# Patient Record
Sex: Female | Born: 2005 | Race: Black or African American | Hispanic: No | Marital: Single
Health system: Southern US, Community
[De-identification: ages and names within clinical notes are randomized; demographics above are authoritative.]

## PROBLEM LIST (undated history)

## (undated) HISTORY — PX: NO PAST SURGERIES: SHX2092

---
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
## 2024-03-06 ENCOUNTER — Emergency Department (HOSPITAL_COMMUNITY)
Admission: EM | Admit: 2024-03-06 | Discharge: 2024-03-07 | Disposition: A | Attending: Emergency Medicine | Admitting: Emergency Medicine

## 2024-03-06 ENCOUNTER — Other Ambulatory Visit: Payer: Self-pay

## 2024-03-06 ENCOUNTER — Encounter (HOSPITAL_COMMUNITY): Payer: Self-pay

## 2024-03-06 DIAGNOSIS — Z3A10 10 weeks gestation of pregnancy: Secondary | ICD-10-CM | POA: Diagnosis not present

## 2024-03-06 DIAGNOSIS — O219 Vomiting of pregnancy, unspecified: Secondary | ICD-10-CM | POA: Diagnosis present

## 2024-03-06 DIAGNOSIS — R112 Nausea with vomiting, unspecified: Secondary | ICD-10-CM

## 2024-03-06 LAB — CBC WITH DIFFERENTIAL/PLATELET
Abs Immature Granulocytes: 0.02 K/uL (ref 0.00–0.07)
Basophils Absolute: 0.1 K/uL (ref 0.0–0.1)
Basophils Relative: 1 %
Eosinophils Absolute: 0.2 K/uL (ref 0.0–0.5)
Eosinophils Relative: 3 %
HCT: 36 % (ref 36.0–46.0)
Hemoglobin: 12.4 g/dL (ref 12.0–15.0)
Immature Granulocytes: 0 %
Lymphocytes Relative: 32 %
Lymphs Abs: 2.5 K/uL (ref 0.7–4.0)
MCH: 29.1 pg (ref 26.0–34.0)
MCHC: 34.4 g/dL (ref 30.0–36.0)
MCV: 84.5 fL (ref 80.0–100.0)
Monocytes Absolute: 0.8 K/uL (ref 0.1–1.0)
Monocytes Relative: 10 %
Neutro Abs: 4.3 K/uL (ref 1.7–7.7)
Neutrophils Relative %: 54 %
Platelets: 172 K/uL (ref 150–400)
RBC: 4.26 MIL/uL (ref 3.87–5.11)
RDW: 12.1 % (ref 11.5–15.5)
WBC: 7.9 K/uL (ref 4.0–10.5)
nRBC: 0 % (ref 0.0–0.2)

## 2024-03-06 MED ORDER — FAMOTIDINE IN NACL 20-0.9 MG/50ML-% IV SOLN
20.0000 mg | Freq: Once | INTRAVENOUS | Status: AC
  Administered 2024-03-07: 20 mg via INTRAVENOUS
  Filled 2024-03-06: qty 50

## 2024-03-06 MED ORDER — ACETAMINOPHEN 500 MG PO TABS
1000.0000 mg | ORAL_TABLET | Freq: Once | ORAL | Status: AC
  Administered 2024-03-06: 1000 mg via ORAL
  Filled 2024-03-06: qty 2

## 2024-03-06 MED ORDER — ONDANSETRON HCL 4 MG/2ML IJ SOLN
4.0000 mg | Freq: Once | INTRAMUSCULAR | Status: DC
  Filled 2024-03-06: qty 2

## 2024-03-06 MED ORDER — LACTATED RINGERS IV BOLUS
1000.0000 mL | Freq: Once | INTRAVENOUS | Status: AC
  Administered 2024-03-06: 1000 mL via INTRAVENOUS

## 2024-03-06 NOTE — ED Triage Notes (Signed)
 Pt to ED from home with c/o emesis during pregnancy, pt reports prenatal care at [facility] in [place], says she is [redacted] weeks pregnant. Reports vomiting red stuff that looked like blood around 10:15. Pt reports eating Trix cereal prior to vomiting. Pt has not vomited in an hour.

## 2024-03-07 DIAGNOSIS — O219 Vomiting of pregnancy, unspecified: Secondary | ICD-10-CM | POA: Diagnosis not present

## 2024-03-07 LAB — COMPREHENSIVE METABOLIC PANEL WITH GFR
ALT: 10 U/L (ref 0–44)
AST: 16 U/L (ref 15–41)
Albumin: 4 g/dL (ref 3.5–5.0)
Alkaline Phosphatase: 65 U/L (ref 38–126)
Anion gap: 10 (ref 5–15)
BUN: 11 mg/dL (ref 6–20)
CO2: 21 mmol/L — ABNORMAL LOW (ref 22–32)
Calcium: 8.9 mg/dL (ref 8.9–10.3)
Chloride: 105 mmol/L (ref 98–111)
Creatinine, Ser: 0.57 mg/dL (ref 0.44–1.00)
GFR, Estimated: >60 mL/min (ref >60)
Glucose, Bld: 81 mg/dL (ref 70–99)
Potassium: 3.9 mmol/L (ref 3.5–5.1)
Sodium: 136 mmol/L (ref 135–145)
Total Bilirubin: <0.2 mg/dL (ref 0.0–1.2)
Total Protein: 6.7 g/dL (ref 6.5–8.1)

## 2024-03-07 LAB — TYPE AND SCREEN
ABO/RH(D): O POS
Antibody Screen: NEGATIVE

## 2024-03-07 LAB — HCG, QUANTITATIVE, PREGNANCY: hCG, Beta Chain, Quant, S: 111612 m[IU]/mL — ABNORMAL HIGH (ref <5)

## 2024-03-07 LAB — LIPASE, BLOOD: Lipase: 20 U/L (ref 11–51)

## 2024-03-07 MED ORDER — ONDANSETRON 4 MG PO TBDP: 4.0000 mg | ORAL_TABLET | Freq: Three times a day (TID) | ORAL | 0 refills | Status: DC | PRN

## 2024-03-07 MED ORDER — PRENATAL VITAMIN 27-0.8 MG PO TABS: 1.0000 | ORAL_TABLET | Freq: Every day | ORAL | 4 refills | Status: AC

## 2024-03-07 MED ORDER — FAMOTIDINE 20 MG PO TABS: 20.0000 mg | ORAL_TABLET | Freq: Two times a day (BID) | ORAL | 0 refills | Status: AC

## 2024-03-07 NOTE — ED Notes (Signed)
 Pt tolerated food & drink without nausea.

## 2024-03-07 NOTE — ED Notes (Signed)
 Pt given crackers & water per order.

## 2024-03-07 NOTE — ED Provider Notes (Signed)
 Emergency Department Provider Note  TRIAGE NOTE: Pt to ED from home with c/o emesis during pregnancy, pt reports prenatal care at [facility] in [place], says she is [redacted] weeks pregnant. Reports vomiting red stuff that looked like blood around 10:15. Pt reports eating Trix cereal prior to vomiting. Pt has not vomited in an hour.  HISTORY  Chief Complaint Emesis During Pregnancy   HPI Kathy Cooke is a 18 y.o. female with  [redacted] weeks pregnant (G1, P0), who presents with nausea and vomiting accompanied by blood in the vomit. She reports having vomited three times, with blood noted in the last two episodes. The vomiting began earlier today, and she also developed a severe headache that has persisted despite waiting for it to subside. She denies any abdominal pain, urinary symptoms, heartburn, or morning sickness thus far in her pregnancy. There is no history of fever, recent illness, or sick contacts. The patient has not taken any medications for her symptoms. She has not yet started prenatal vitamins. History was obtained from the patient.  PMH History reviewed. No pertinent past medical history.  Home Medications Prior to Admission medications   Medication Sig Start Date End Date Taking? Authorizing Provider  famotidine (PEPCID) 20 MG tablet Take 1 tablet (20 mg total) by mouth 2 (two) times daily. 03/07/24  Yes Wrigley Plasencia, Selinda, MD  ondansetron  (ZOFRAN -ODT) 4 MG disintegrating tablet Take 1 tablet (4 mg total) by mouth every 8 (eight) hours as needed for vomiting. 03/07/24  Yes Doctor Sheahan, Selinda, MD  Prenatal Vit-Fe Fumarate-FA (PRENATAL VITAMIN) 27-0.8 MG TABS Take 1 tablet by mouth daily. 03/07/24  Yes Kaley Jutras, Selinda, MD  anti-nausea (EMETROL) solution Take 10 mLs by mouth every 15 (fifteen) minutes as needed for nausea or vomiting.    [provider]  diphenhydrAMINE (BENADRYL) 12.5 MG/5ML elixir Take 12.5 mg by mouth 4 (four) times daily as needed for allergies.    [provider]     Social History Social History   Tobacco Use   Smoking status: Passive Smoke Exposure - Never Smoker   Smokeless tobacco: Never  Vaping Use   Vaping status: Never Used  Substance Use Topics   Alcohol use: No   Drug use: No    Review of Systems: Documented in HPI ____________________________________________  PHYSICAL EXAM: VITAL SIGNS: Triage: Blood pressure 119/88, pulse 74, temperature 98 F (36.7 C), temperature source Oral, resp. rate 16, height 4' 11 (1.499 m), weight 53.3 kg, last menstrual period 12/13/2023, SpO2 100%.  Vitals:   03/06/24 2321 03/07/24 0225 03/07/24 0320  BP: 132/87 119/88 119/88  Pulse: 72 74 74  Resp: 17 16 16   Temp: 98 F (36.7 C) 98 F (36.7 C) 98 F (36.7 C)  TempSrc:  Oral Oral  SpO2: 99% 100% 100%  Weight: 53.3 kg    Height: 4' 11 (1.499 m)      Physical Exam Vitals and nursing note reviewed.  Constitutional:      Appearance: She is well-developed.  HENT:     Head: Normocephalic and atraumatic.  Cardiovascular:     Rate and Rhythm: Normal rate and regular rhythm.  Pulmonary:     Effort: No respiratory distress.     Breath sounds: No stridor.  Abdominal:     General: There is no distension.  Musculoskeletal:     Cervical back: Normal range of motion.  Neurological:     Mental Status: She is alert.       ____________________________________________   LABS (all labs ordered are  listed, but only abnormal results are displayed)  Labs Reviewed  COMPREHENSIVE METABOLIC PANEL WITH GFR - Abnormal; Notable for the following components:      Result Value   CO2 21 (*)    All other components within normal limits  HCG, QUANTITATIVE, PREGNANCY - Abnormal; Notable for the following components:   hCG, Beta Chain, Quant, S [id number] (*)    All other components within normal limits  CBC WITH DIFFERENTIAL/PLATELET  LIPASE, BLOOD  TYPE AND SCREEN   ____________________________________________  EKG   EKG  Interpretation Date/Time:    Ventricular Rate:    PR Interval:    QRS Duration:    QT Interval:    QTC Calculation:   R Axis:      Text Interpretation:          ____________________________________________  RADIOLOGY  No results found. ____________________________________________  PROCEDURES  Procedure(s) performed:   Procedures ____________________________________________  INITIAL IMPRESSION / ASSESSMENT AND PLAN   Clinical Course as of 03/07/24 0511  Tue Mar 07, 2024  0010 Initial Evaluation:  Patient is pregnant at approximately ten weeks gestation, presenting with abdominal discomfort, nausea, vomiting, and blood in the vomit on two occasions. Plan:  Administer intravenous fluids Administer Tylenol for headache relief Prescribe H2 blocker for gastric protection Perform an ultrasound to assess fetal well-being and confirm intrauterine pregnancy Discharge with follow-up with obstetrics if condition stabilizes Provide a prescription for prenatal vitamins [JM]  0045 Potassium: 3.9 [JM]  0045 CO2(!): 21 [JM]  0045 Creatinine: 0.57 [JM]  0045 WBC: 7.9 [JM]  0045 HCG, Beta Chain, Earleen RAMAN(!): [id number] BUS showed IUP with good fetal movement, good HR [JM]    Clinical Course User Index [JM] Cristian Davitt, Selinda, MD     Images ordered viewed and obtained by myself. Agree with Radiology interpretation. Details in ED course.  Labs ordered reviewed by myself as detailed in ED course.  Consultations obtained/considered detailed in ED course.    CRITICAL INTERVENTIONS:  N/a  Bedside ultrasound reassuring good fetal movement and heart rate labs reassuring.  Symptoms improved.  Low suspicion for hepatobiliary or other metabolic issues.  Patient will continue Pepcid at home with OB follow-up.  Prenatal vitamins sent to the pharmacy.SABRA   FINAL IMPRESSION Final diagnoses:  Nausea and vomiting, unspecified vomiting type     Disposition A medical screening exam was  performed and I feel the patient has had an appropriate workup for their chief complaint at this time and likelihood of emergent condition existing is low. They have been counseled on decision, DISCHARGE, follow up and which symptoms necessitate immediate return to the emergency department. They or their family verbally stated understanding and agreement with plan and discharged in stable condition.   ____________________________________________   NEW OUTPATIENT MEDICATIONS STARTED DURING THIS VISIT:  Discharge Medication List as of 03/07/2024  3:15 AM     START taking these medications   Details  famotidine (PEPCID) 20 MG tablet Take 1 tablet (20 mg total) by mouth 2 (two) times daily., Starting Tue 03/07/2024, Normal        Note:  This note was prepared with assistance of Dragon voice recognition software. Occasional wrong-word or sound-a-like substitutions may have occurred due to the inherent limitations of voice recognition software.    Lorette Selinda, MD 03/07/24 [phone]

## 2024-03-25 ENCOUNTER — Other Ambulatory Visit: Payer: Self-pay

## 2024-03-25 ENCOUNTER — Encounter (HOSPITAL_COMMUNITY): Payer: Self-pay | Admitting: Emergency Medicine

## 2024-03-25 ENCOUNTER — Emergency Department (HOSPITAL_COMMUNITY)
Admission: EM | Admit: 2024-03-25 | Discharge: 2024-03-25 | Disposition: A | Discharge status: Home / Self Care | Attending: Emergency Medicine | Admitting: Emergency Medicine

## 2024-03-25 DIAGNOSIS — O219 Vomiting of pregnancy, unspecified: Secondary | ICD-10-CM | POA: Insufficient documentation

## 2024-03-25 DIAGNOSIS — E860 Dehydration: Secondary | ICD-10-CM | POA: Insufficient documentation

## 2024-03-25 DIAGNOSIS — Z3A13 13 weeks gestation of pregnancy: Secondary | ICD-10-CM | POA: Insufficient documentation

## 2024-03-25 DIAGNOSIS — R519 Headache, unspecified: Secondary | ICD-10-CM | POA: Insufficient documentation

## 2024-03-25 DIAGNOSIS — M545 Low back pain, unspecified: Secondary | ICD-10-CM | POA: Insufficient documentation

## 2024-03-25 DIAGNOSIS — D72829 Elevated white blood cell count, unspecified: Secondary | ICD-10-CM | POA: Insufficient documentation

## 2024-03-25 DIAGNOSIS — N390 Urinary tract infection, site not specified: Secondary | ICD-10-CM

## 2024-03-25 DIAGNOSIS — O2341 Unspecified infection of urinary tract in pregnancy, first trimester: Secondary | ICD-10-CM | POA: Insufficient documentation

## 2024-03-25 LAB — URINALYSIS, ROUTINE W REFLEX MICROSCOPIC
Bilirubin Urine: NEGATIVE
Glucose, UA: NEGATIVE mg/dL
Ketones, ur: 20 mg/dL — AB
Nitrite: POSITIVE — AB
Protein, ur: 30 mg/dL — AB
Specific Gravity, Urine: 1.015 (ref 1.005–1.030)
WBC, UA: >50 WBC/hpf (ref 0–5)
pH: 6 (ref 5.0–8.0)

## 2024-03-25 LAB — CBC WITH DIFFERENTIAL/PLATELET
Abs Immature Granulocytes: 0.04 K/uL (ref 0.00–0.07)
Basophils Absolute: 0 K/uL (ref 0.0–0.1)
Basophils Relative: 0 %
Eosinophils Absolute: 0 K/uL (ref 0.0–0.5)
Eosinophils Relative: 0 %
HCT: 33.7 % — ABNORMAL LOW (ref 36.0–46.0)
Hemoglobin: 11.8 g/dL — ABNORMAL LOW (ref 12.0–15.0)
Immature Granulocytes: 0 %
Lymphocytes Relative: 9 %
Lymphs Abs: 1.1 K/uL (ref 0.7–4.0)
MCH: 28.8 pg (ref 26.0–34.0)
MCHC: 35 g/dL (ref 30.0–36.0)
MCV: 82.2 fL (ref 80.0–100.0)
Monocytes Absolute: 1.3 K/uL — ABNORMAL HIGH (ref 0.1–1.0)
Monocytes Relative: 11 %
Neutro Abs: 9.7 K/uL — ABNORMAL HIGH (ref 1.7–7.7)
Neutrophils Relative %: 80 %
Platelets: 191 K/uL (ref 150–400)
RBC: 4.1 MIL/uL (ref 3.87–5.11)
RDW: 11.4 % — ABNORMAL LOW (ref 11.5–15.5)
WBC: 12.2 K/uL — ABNORMAL HIGH (ref 4.0–10.5)
nRBC: 0 % (ref 0.0–0.2)

## 2024-03-25 LAB — COMPREHENSIVE METABOLIC PANEL WITH GFR
ALT: 13 U/L (ref 0–44)
AST: 18 U/L (ref 15–41)
Albumin: 3.9 g/dL (ref 3.5–5.0)
Alkaline Phosphatase: 76 U/L (ref 38–126)
Anion gap: 10 (ref 5–15)
BUN: 9 mg/dL (ref 6–20)
CO2: 23 mmol/L (ref 22–32)
Calcium: 9.3 mg/dL (ref 8.9–10.3)
Chloride: 99 mmol/L (ref 98–111)
Creatinine, Ser: 0.5 mg/dL (ref 0.44–1.00)
GFR, Estimated: >60 mL/min (ref >60)
Glucose, Bld: 102 mg/dL — ABNORMAL HIGH (ref 70–99)
Potassium: 3.8 mmol/L (ref 3.5–5.1)
Sodium: 133 mmol/L — ABNORMAL LOW (ref 135–145)
Total Bilirubin: 0.3 mg/dL (ref 0.0–1.2)
Total Protein: 7.3 g/dL (ref 6.5–8.1)

## 2024-03-25 LAB — LIPASE, BLOOD: Lipase: 13 U/L (ref 11–51)

## 2024-03-25 MED ORDER — LIDOCAINE 5 % EX PTCH
1.0000 | MEDICATED_PATCH | Freq: Once | CUTANEOUS | Status: DC
  Administered 2024-03-25: 1 via TRANSDERMAL
  Filled 2024-03-25: qty 1

## 2024-03-25 MED ORDER — CEPHALEXIN 500 MG PO CAPS: 500.0000 mg | ORAL_CAPSULE | Freq: Two times a day (BID) | ORAL | 0 refills | Status: AC

## 2024-03-25 MED ORDER — METOCLOPRAMIDE HCL 10 MG PO TABS: 10.0000 mg | ORAL_TABLET | Freq: Four times a day (QID) | ORAL | 0 refills | Status: DC

## 2024-03-25 MED ORDER — METOCLOPRAMIDE HCL 5 MG/ML IJ SOLN
10.0000 mg | Freq: Once | INTRAMUSCULAR | Status: AC
  Administered 2024-03-25: 10 mg via INTRAVENOUS
  Filled 2024-03-25: qty 2

## 2024-03-25 MED ORDER — LACTATED RINGERS IV BOLUS
1000.0000 mL | Freq: Once | INTRAVENOUS | Status: AC
  Administered 2024-03-25: 1000 mL via INTRAVENOUS

## 2024-03-25 MED ORDER — SODIUM CHLORIDE 0.9 % IV SOLN
1.0000 g | Freq: Once | INTRAVENOUS | Status: AC
  Administered 2024-03-25: 1 g via INTRAVENOUS
  Filled 2024-03-25: qty 10

## 2024-03-25 NOTE — ED Triage Notes (Signed)
 Patient c/o n/v and lower back pain x 2 days.  Patient is [redacted] weeks pregnant, has been taking zofran  without relief.  Patient's right eye is red from vomiting per patient.

## 2024-03-25 NOTE — Discharge Instructions (Addendum)
 Begin taking Keflex  as prescribed.  Stay hydrated and drink plenty of clear fluids.  May take Tylenol  every 6 hours as needed for pain.  May take Reglan  every 6-8 hours as needed for any nausea/vomiting.  Do not take Zofran  at the same time.  Follow-up with OB as scheduled in the next couple weeks for recheck and to recheck urine as well.  Return to ED if any symptoms worsen including uncontrolled nausea/vomiting, severe abdominal pain, vaginal bleeding/spotting, severe blood in the urine.

## 2024-03-25 NOTE — ED Provider Notes (Signed)
 [place] EMERGENCY DEPARTMENT AT [facility] Provider Note   CSN: [id number] Arrival date & time: 03/25/24  1941     Patient presents with: Emesis and Back Pain   Kathy Cooke is a 18 y.o. female.  Patient is a 19 year old female at approximately [redacted] weeks gestation who presents to the ED for increasing nausea/vomiting for the past 3 days.  She also notes she has had right lower back pain for the past 2 days.  She states she has been taking Zofran  with minimal relief.  She also notes that her eye has become red over the past 2 days from vomiting so much.  States vision does feel normal though.  States she had a headache a few days ago but that has improved.  She has not taken any Tylenol  today.  She has seen OB and has had confirmed pregnancy on ultrasound.  Denies fevers, dizziness, syncope, chest pain, shortness of breath, abdominal pain, pelvic pain/cramping, vaginal bleeding/spotting, urinary symptoms.    Emesis Associated symptoms: no abdominal pain and no fever   Back Pain Associated symptoms: no abdominal pain, no chest pain, no dysuria, no fever and no pelvic pain        Prior to Admission medications   Medication Sig Start Date End Date Taking? Authorizing Provider  cephALEXin  (KEFLEX ) 500 MG capsule Take 1 capsule (500 mg total) by mouth 2 (two) times daily for 7 days. 03/25/24 04/01/24 Yes Sunset Joshi, Thersia RAMAN, PA-C  metoCLOPramide  (REGLAN ) 10 MG tablet Take 1 tablet (10 mg total) by mouth every 6 (six) hours for 5 days. 03/25/24 03/30/24 Yes Zymarion Favorite S, PA-C  anti-nausea (EMETROL) solution Take 10 mLs by mouth every 15 (fifteen) minutes as needed for nausea or vomiting.    [provider]  diphenhydrAMINE (BENADRYL) 12.5 MG/5ML elixir Take 12.5 mg by mouth 4 (four) times daily as needed for allergies.    [provider]  famotidine  (PEPCID ) 20 MG tablet Take 1 tablet (20 mg total) by mouth 2 (two) times daily. 03/07/24   Mesner, Selinda, MD   ondansetron  (ZOFRAN -ODT) 4 MG disintegrating tablet Take 1 tablet (4 mg total) by mouth every 8 (eight) hours as needed for vomiting. 03/07/24   Mesner, Selinda, MD  Prenatal Vit-Fe Fumarate-FA (PRENATAL VITAMIN) 27-0.8 MG TABS Take 1 tablet by mouth daily. 03/07/24   Mesner, Selinda, MD    Allergies: Patient has no known allergies.    Review of Systems  Constitutional:  Negative for fever.  Eyes:  Negative for visual disturbance.  Respiratory:  Negative for shortness of breath.   Cardiovascular:  Negative for chest pain.  Gastrointestinal:  Positive for nausea and vomiting. Negative for abdominal pain.  Genitourinary:  Negative for dysuria, pelvic pain, vaginal bleeding, vaginal discharge and vaginal pain.  Musculoskeletal:  Positive for back pain.  All other systems reviewed and are negative.   Updated Vital Signs BP 124/68   Pulse (!) 106   Temp 98.9 F (37.2 C) (Oral)   Ht 4' 11 (1.499 m)   Wt 52.2 kg   LMP 12/13/2023 (Approximate)   SpO2 99%   BMI 23.23 kg/m   Physical Exam Constitutional:      Appearance: Normal appearance.  HENT:     Head: Normocephalic and atraumatic.     Nose: Nose normal.     Mouth/Throat:     Mouth: Mucous membranes are moist.     Pharynx: Oropharynx is clear.  Eyes:     Extraocular Movements: Extraocular movements intact.  Pupils: Pupils are equal, round, and reactive to light.     Comments: Subconjunctival hematoma present on the right sclera.  Visual acuity intact.  Cardiovascular:     Rate and Rhythm: Normal rate.  Pulmonary:     Effort: Pulmonary effort is normal.  Abdominal:     General: Bowel sounds are normal.     Palpations: Abdomen is soft.     Tenderness: There is no abdominal tenderness.  Musculoskeletal:        General: Normal range of motion.     Comments: Left lower lateral flank tenderness.  No deformities, erythema, edema noted.  Full range of motion of bilateral lower extremities equal strength.  Skin:    General:  Skin is warm and dry.  Neurological:     Mental Status: She is alert and oriented to person, place, and time.  Psychiatric:        Mood and Affect: Mood normal.        Behavior: Behavior normal.     (all labs ordered are listed, but only abnormal results are displayed) Labs Reviewed  COMPREHENSIVE METABOLIC PANEL WITH GFR - Abnormal; Notable for the following components:      Result Value   Sodium 133 (*)    Glucose, Bld 102 (*)    All other components within normal limits  CBC WITH DIFFERENTIAL/PLATELET - Abnormal; Notable for the following components:   WBC 12.2 (*)    Hemoglobin 11.8 (*)    HCT 33.7 (*)    RDW 11.4 (*)    Neutro Abs 9.7 (*)    Monocytes Absolute 1.3 (*)    All other components within normal limits  URINALYSIS, ROUTINE W REFLEX MICROSCOPIC - Abnormal; Notable for the following components:   APPearance HAZY (*)    Hgb urine dipstick SMALL (*)    Ketones, ur 20 (*)    Protein, ur 30 (*)    Nitrite POSITIVE (*)    Leukocytes,Ua LARGE (*)    Bacteria, UA MANY (*)    All other components within normal limits  LIPASE, BLOOD    EKG: None  Radiology: No results found.    Medications Ordered in the ED  lidocaine  (LIDODERM ) 5 % 1 patch (1 patch Transdermal Patch Applied 03/25/24 2027)  cefTRIAXone  (ROCEPHIN ) 1 g in sodium chloride  0.9 % 100 mL IVPB (has no administration in time range)  lactated ringers  bolus 1,000 mL (0 mLs Intravenous Stopped 03/25/24 2124)  metoCLOPramide  (REGLAN ) injection 10 mg (10 mg Intravenous Given 03/25/24 2026)                                   Medical Decision Making Patient is a 18 year old female at approximately [redacted] weeks gestation who presents to the ED for increasing nausea/vomiting for the past 2 days.  Patient also notes some left lower back pain as well.  Please see detailed HPI above.  On exam patient is alert and in no acute distress.  Physical exam as noted above.  No abdominal tenderness appreciated.  No vaginal  bleeding/spotting noted, no concerns for emergent imaging.  UA does show concerns for UTI as well as some dehydration.  Mild leukocytosis of 12.2 noted on labs but otherwise creatinine normal.  Mild hyponatremia 133.  Patient was given a liter of fluids, Reglan , and Tylenol  and states she is feeling much better.  She was able to tolerate oral intake well.  Suspect patient's  back pain could be secondary to UTI but less concerns for pyelonephritis in the setting of reassuring labs.  Otherwise well-appearing and stable for discharge home.  Patient given Rocephin  while in ED.  Prescribed Keflex  and Reglan .  Symptomatic care discussed.  Advised to follow-up with OB as scheduled in the next 2 weeks for recheck.  Strict return precautions provided.  Amount and/or Complexity of Data Reviewed Labs: ordered.  Risk Prescription drug management.      Final diagnoses:  Vomiting pregnancy  Urinary tract infection with hematuria, site unspecified    ED Discharge Orders          Ordered    cephALEXin  (KEFLEX ) 500 MG capsule  2 times daily        03/25/24 2158    metoCLOPramide  (REGLAN ) 10 MG tablet  Every 6 hours        03/25/24 2158               Neysa Thersia GORMAN DEVONNA 03/25/24 2205    Melvenia Motto, MD 03/26/24 [phone]

## 2024-04-11 LAB — PANORAMA PRENATAL TEST FULL PANEL:PANORAMA TEST PLUS 5 ADDITIONAL MICRODELETIONS: FETAL FRACTION: 7.6

## 2024-05-26 ENCOUNTER — Encounter (HOSPITAL_COMMUNITY): Payer: Self-pay | Admitting: Emergency Medicine

## 2024-05-26 ENCOUNTER — Emergency Department (HOSPITAL_COMMUNITY)
Admission: EM | Admit: 2024-05-26 | Discharge: 2024-05-26 | Disposition: A | Discharge status: Home / Self Care | Source: Home / Self Care | Attending: Emergency Medicine | Admitting: Emergency Medicine

## 2024-05-26 ENCOUNTER — Other Ambulatory Visit: Payer: Self-pay

## 2024-05-26 DIAGNOSIS — R112 Nausea with vomiting, unspecified: Secondary | ICD-10-CM

## 2024-05-26 DIAGNOSIS — E860 Dehydration: Secondary | ICD-10-CM

## 2024-05-26 LAB — CBC WITH DIFFERENTIAL/PLATELET
Abs Immature Granulocytes: 0.08 10*3/uL — ABNORMAL HIGH (ref 0.00–0.07)
Basophils Absolute: 0 10*3/uL (ref 0.0–0.1)
Basophils Relative: 0 %
Eosinophils Absolute: 0.1 10*3/uL (ref 0.0–0.5)
Eosinophils Relative: 1 %
HCT: 34.2 % — ABNORMAL LOW (ref 36.0–46.0)
Hemoglobin: 11.7 g/dL — ABNORMAL LOW (ref 12.0–15.0)
Immature Granulocytes: 1 %
Lymphocytes Relative: 9 %
Lymphs Abs: 1 10*3/uL (ref 0.7–4.0)
MCH: 29.2 pg (ref 26.0–34.0)
MCHC: 34.2 g/dL (ref 30.0–36.0)
MCV: 85.3 fL (ref 80.0–100.0)
Monocytes Absolute: 0.9 10*3/uL (ref 0.1–1.0)
Monocytes Relative: 9 %
Neutro Abs: 8.6 10*3/uL — ABNORMAL HIGH (ref 1.7–7.7)
Neutrophils Relative %: 80 %
Platelets: 212 10*3/uL (ref 150–400)
RBC: 4.01 MIL/uL (ref 3.87–5.11)
RDW: 13.2 % (ref 11.5–15.5)
WBC: 10.7 10*3/uL — ABNORMAL HIGH (ref 4.0–10.5)
nRBC: 0 % (ref 0.0–0.2)

## 2024-05-26 LAB — COMPREHENSIVE METABOLIC PANEL WITH GFR
ALT: 25 U/L (ref 0–44)
AST: 24 U/L (ref 15–41)
Albumin: 3.7 g/dL (ref 3.5–5.0)
Alkaline Phosphatase: 113 U/L (ref 38–126)
Anion gap: 15 (ref 5–15)
BUN: 8 mg/dL (ref 6–20)
CO2: 19 mmol/L — ABNORMAL LOW (ref 22–32)
Calcium: 8.5 mg/dL — ABNORMAL LOW (ref 8.9–10.3)
Chloride: 107 mmol/L (ref 98–111)
Creatinine, Ser: 0.57 mg/dL (ref 0.44–1.00)
GFR, Estimated: >60 mL/min
Glucose, Bld: 103 mg/dL — ABNORMAL HIGH (ref 70–99)
Potassium: 3.6 mmol/L (ref 3.5–5.1)
Sodium: 140 mmol/L (ref 135–145)
Total Bilirubin: 0.3 mg/dL (ref 0.0–1.2)
Total Protein: 6.9 g/dL (ref 6.5–8.1)

## 2024-05-26 LAB — LIPASE, BLOOD: Lipase: 21 U/L (ref 11–51)

## 2024-05-26 MED ORDER — SODIUM CHLORIDE 0.9 % IV BOLUS
1000.0000 mL | Freq: Once | INTRAVENOUS | Status: AC
  Administered 2024-05-26: 1000 mL via INTRAVENOUS

## 2024-05-26 MED ORDER — ONDANSETRON 4 MG PO TBDP: 4.0000 mg | ORAL_TABLET | Freq: Three times a day (TID) | ORAL | 0 refills | Status: AC | PRN

## 2024-05-26 MED ORDER — ONDANSETRON HCL 4 MG/2ML IJ SOLN
4.0000 mg | Freq: Once | INTRAMUSCULAR | Status: AC
  Administered 2024-05-26: 4 mg via INTRAVENOUS
  Filled 2024-05-26: qty 2

## 2024-05-26 NOTE — Discharge Instructions (Addendum)

## 2024-05-26 NOTE — ED Triage Notes (Signed)
 Pt c/o N/V/D x lastnight, states she has had 3 episodes of diarrhea and then it stopped but the vomiting has been continuous. Pt is also approx. [redacted] weeks pregnant, states this is her first pregnancy. Also states she can still feel movement from fetus. C/o HA and abd  mid pressure 8/10

## 2024-05-26 NOTE — ED Notes (Signed)
 Pt has not had another episode of emesis since IV Zofran  administration, pt given water for PO challenge

## 2024-05-26 NOTE — ED Provider Notes (Signed)
 " [place] EMERGENCY DEPARTMENT AT [facility] Provider Note   CSN: [id number] Arrival date & time: 05/26/24  9750     Patient presents with: Emesis   Kathy Cooke is a 19 y.o. female.   The history is provided by the patient.  Patient is G1, P0 at approximately 21 weeks presents with nausea vomiting diarrhea Patient reports several hours ago she had multiple episodes of nonbloody diarrhea.  She is now having multiple episodes of emesis.  She also reports abdominal pressure, but no vaginal bleeding or vaginal discharge.  She has had an uncomplicated pregnancy thus far No recent travel, no known sick contacts     Prior to Admission medications  Medication Sig Start Date End Date Taking? Authorizing Provider  ondansetron  (ZOFRAN -ODT) 4 MG disintegrating tablet Take 1 tablet (4 mg total) by mouth every 8 (eight) hours as needed. 05/26/24  Yes Midge Golas, MD  anti-nausea (EMETROL) solution Take 10 mLs by mouth every 15 (fifteen) minutes as needed for nausea or vomiting.    [provider]  diphenhydrAMINE (BENADRYL) 12.5 MG/5ML elixir Take 12.5 mg by mouth 4 (four) times daily as needed for allergies.    [provider]  famotidine  (PEPCID ) 20 MG tablet Take 1 tablet (20 mg total) by mouth 2 (two) times daily. 03/07/24   Mesner, Selinda, MD  Prenatal Vit-Fe Fumarate-FA (PRENATAL VITAMIN) 27-0.8 MG TABS Take 1 tablet by mouth daily. 03/07/24   Mesner, Selinda, MD    Allergies: Patient has no known allergies.    Review of Systems  Constitutional:  Negative for fever.  Gastrointestinal:  Positive for abdominal pain, diarrhea, nausea and vomiting. Negative for blood in stool.  Genitourinary:  Negative for vaginal bleeding.    Updated Vital Signs BP 119/85   Pulse (!) 109   Temp 98.4 F (36.9 C) (Oral)   Resp 17   LMP 12/13/2023 (Approximate)   SpO2 100%   Physical Exam CONSTITUTIONAL: Well developed/well nourished, actively vomiting HEAD:  Normocephalic/atraumatic EYES: EOMI/PERRL, no icterus NECK: supple no meningeal signs CV: S1/S2 noted, tachycardic LUNGS: Lungs are clear to auscultation bilaterally, no apparent distress ABDOMEN: soft, nontender, no rebound or guarding NEURO: Pt is awake/alert/appropriate, moves all extremitiesx4.  No facial droop.   EXTREMITIES:full ROM SKIN: warm, color normal PSYCH: Anxious  (all labs ordered are listed, but only abnormal results are displayed) Labs Reviewed  CBC WITH DIFFERENTIAL/PLATELET - Abnormal; Notable for the following components:      Result Value   WBC 10.7 (*)    Hemoglobin 11.7 (*)    HCT 34.2 (*)    Neutro Abs 8.6 (*)    Abs Immature Granulocytes 0.08 (*)    All other components within normal limits  COMPREHENSIVE METABOLIC PANEL WITH GFR - Abnormal; Notable for the following components:   CO2 19 (*)    Glucose, Bld 103 (*)    Calcium 8.5 (*)    All other components within normal limits  LIPASE, BLOOD    EKG: None  Radiology: No results found.   Procedures   Medications Ordered in the ED  ondansetron  (ZOFRAN ) injection 4 mg (4 mg Intravenous Given 05/26/24 0329)  sodium chloride  0.9 % bolus 1,000 mL (0 mLs Intravenous Stopped 05/26/24 0409)  sodium chloride  0.9 % bolus 1,000 mL (1,000 mLs Intravenous Bolus 05/26/24 0425)    Clinical Course as of 05/26/24 0502  Fri May 26, 2024  9647 Patient presents with abrupt onset nausea vomiting diarrhea.  After IV fluids and Zofran ,  she is already feeling improved.  She has no focal abdominal tenderness.  Fetal heart tones in the 140s.  She is feeling movement Labs are pending at this time [DW]  0440 CO2(!): 19 Mild dehydration [DW]  0501 Patient feels much improved.  She has no pain.  No new vomiting or diarrhea. She is taking oral fluids. Vital signs and heart rate are improving [DW]  0502 I have suspicion this represents a viral gastroenteritis.  No indication for CT imaging or ultrasound imaging.  Patient is  not appear to have any obstetric complication [DW]    Clinical Course User Index [DW] Midge Golas, MD                                 Medical Decision Making Amount and/or Complexity of Data Reviewed Labs: ordered. Decision-making details documented in ED Course.  Risk Prescription drug management.        Final diagnoses:  Nausea vomiting and diarrhea  Dehydration    ED Discharge Orders          Ordered    ondansetron  (ZOFRAN -ODT) 4 MG disintegrating tablet  Every 8 hours PRN        05/26/24 0501               Midge Golas, MD 05/26/24 0502  "
